# Patient Record
Sex: Female | Born: 1940 | Race: White | Hispanic: No | Smoking: Never smoker
Health system: Southern US, Community
[De-identification: ages and names within clinical notes are randomized; demographics above are authoritative.]

## PROBLEM LIST (undated history)

## (undated) ENCOUNTER — Emergency Department (HOSPITAL_BASED_OUTPATIENT_CLINIC_OR_DEPARTMENT_OTHER): Payer: Medicare Other | Source: Home / Self Care

## (undated) DIAGNOSIS — I10 Essential (primary) hypertension: Secondary | ICD-10-CM

## (undated) DIAGNOSIS — J40 Bronchitis, not specified as acute or chronic: Secondary | ICD-10-CM

## (undated) HISTORY — PX: KNEE SURGERY: SHX244

---

## 2017-12-04 ENCOUNTER — Emergency Department (HOSPITAL_BASED_OUTPATIENT_CLINIC_OR_DEPARTMENT_OTHER): Payer: Medicare Other

## 2017-12-04 ENCOUNTER — Encounter (HOSPITAL_BASED_OUTPATIENT_CLINIC_OR_DEPARTMENT_OTHER): Payer: Self-pay | Admitting: *Deleted

## 2017-12-04 ENCOUNTER — Other Ambulatory Visit: Payer: Self-pay

## 2017-12-04 ENCOUNTER — Emergency Department (HOSPITAL_BASED_OUTPATIENT_CLINIC_OR_DEPARTMENT_OTHER)
Admission: EM | Admit: 2017-12-04 | Discharge: 2017-12-05 | Disposition: A | Discharge status: Home / Self Care | Payer: Medicare Other | Attending: Emergency Medicine | Admitting: Emergency Medicine

## 2017-12-04 DIAGNOSIS — I10 Essential (primary) hypertension: Secondary | ICD-10-CM | POA: Insufficient documentation

## 2017-12-04 DIAGNOSIS — R062 Wheezing: Secondary | ICD-10-CM | POA: Diagnosis present

## 2017-12-04 DIAGNOSIS — J189 Pneumonia, unspecified organism: Secondary | ICD-10-CM | POA: Insufficient documentation

## 2017-12-04 HISTORY — DX: Bronchitis, not specified as acute or chronic: J40

## 2017-12-04 HISTORY — DX: Essential (primary) hypertension: I10

## 2017-12-04 MED ORDER — ALBUTEROL SULFATE (2.5 MG/3ML) 0.083% IN NEBU
2.5000 mg | INHALATION_SOLUTION | Freq: Once | RESPIRATORY_TRACT | Status: AC
  Administered 2017-12-04: 2.5 mg via RESPIRATORY_TRACT
  Filled 2017-12-04: qty 3

## 2017-12-04 MED ORDER — ALBUTEROL SULFATE (2.5 MG/3ML) 0.083% IN NEBU
5.0000 mg | INHALATION_SOLUTION | Freq: Once | RESPIRATORY_TRACT | Status: AC
  Administered 2017-12-04: 5 mg via RESPIRATORY_TRACT
  Filled 2017-12-04: qty 6

## 2017-12-04 MED ORDER — METHYLPREDNISOLONE SODIUM SUCC 125 MG IJ SOLR
125.0000 mg | Freq: Once | INTRAMUSCULAR | Status: DC
  Filled 2017-12-04: qty 2

## 2017-12-04 NOTE — ED Triage Notes (Addendum)
Wheezing. Diagnosed with bronchitis a week ago. She has been seen 3 times this week at [facility]. She is able to speak in complete sentences with no wheezing or upper airway noises. She has audible wheezing only when lungs are being evaluated.

## 2017-12-04 NOTE — ED Provider Notes (Signed)
[facility] EMERGENCY DEPARTMENT Provider Note   CSN: [id number] Arrival date & time: 12/04/17  1948     History   Chief Complaint Chief Complaint  Patient presents with  . Wheezing    HPI Maria Patrick is a 77 y.o. female.  This patient is a 77 year old female with past medical history of hypertension, bronchitis, knee surgery presenting for evaluation of chest congestion, wheezing, and cough that has worsened over the past week.  She has been seen on 3 separate occasions by her primary doctor and urgent care.  She is taking steroids, Zithromax, and an inhaler with little relief.  She tells me she experienced a pulmonary embolism after a knee surgery last year.  She was on Eliquis for 6 months, and this was discontinued in January.  She denies any fevers or chills.   The history is provided by the patient.  Wheezing   This is a new problem. Episode onset: 1 week ago. The problem occurs constantly. The problem has been gradually worsening. Pertinent negatives include no chest pain, no fever and no sputum production. She has tried nothing for the symptoms. Her past medical history is significant for PE.    Past Medical History:  Diagnosis Date  . Bronchitis   . Hypertension     There are no active problems to display for this patient.   Past Surgical History:  Procedure Laterality Date  . KNEE SURGERY      OB History    No data available       Home Medications    Prior to Admission medications   Medication Sig Start Date End Date Taking? Authorizing Provider  ALBUTEROL IN Inhale into the lungs.   Yes [provider]  ALPRAZolam (NIRAVAM) 0.25 MG dissolvable tablet Take 0.25 mg by mouth at bedtime as needed for anxiety.   Yes [provider]  HYDROCHLOROTHIAZIDE PO Take by mouth.   Yes [provider]  QUINAPRIL HCL PO Take by mouth.   Yes [provider]    Family History No family history on file.  Social  History Social History   Tobacco Use  . Smoking status: Never Smoker  . Smokeless tobacco: Never Used  Substance Use Topics  . Alcohol use: Yes  . Drug use: No     Allergies   Ceclor [cefaclor] and Clindamycin/lincomycin   Review of Systems Review of Systems  Constitutional: Negative for fever.  Respiratory: Positive for wheezing. Negative for sputum production.   Cardiovascular: Negative for chest pain.  All other systems reviewed and are negative.    Physical Exam Updated Vital Signs BP (!) 161/83 (BP Location: Right Arm)   Pulse 81   Temp 98.3 F (36.8 C) (Oral)   Resp 20   Ht 5' 4.5" (1.638 m)   Wt 81.6 kg (180 lb)   SpO2 94%   BMI 30.42 kg/m   Physical Exam  Constitutional: She is oriented to person, place, and time. She appears well-developed and well-nourished. No distress.  HENT:  Head: Normocephalic and atraumatic.  Neck: Normal range of motion. Neck supple.  Cardiovascular: Normal rate and regular rhythm. Exam reveals no gallop and no friction rub.  No murmur heard. Pulmonary/Chest: Effort normal. No respiratory distress. She has wheezes.  Expiratory rhonchi present.  Abdominal: Soft. Bowel sounds are normal. She exhibits no distension. There is no tenderness.  Musculoskeletal: Normal range of motion. She exhibits no edema.  There is no calf tenderness.  Homans sign is absent bilaterally.  Neurological: She is alert and oriented to person, place, and time.  Skin: Skin is warm and dry. She is not diaphoretic.  Nursing note and vitals reviewed.    ED Treatments / Results  Labs (all labs ordered are listed, but only abnormal results are displayed) Labs Reviewed  BASIC METABOLIC PANEL  CBC WITH DIFFERENTIAL/PLATELET  BRAIN NATRIURETIC PEPTIDE    EKG  EKG Interpretation None       Radiology No results found.  Procedures Procedures (including critical care time)  Medications Ordered in ED Medications  methylPREDNISolone sodium  succinate (SOLU-MEDROL) 125 mg/2 mL injection 125 mg (not administered)  albuterol (PROVENTIL) (2.5 MG/3ML) 0.083% nebulizer solution 5 mg (5 mg Nebulization Given 12/04/17 2012)  albuterol (PROVENTIL) (2.5 MG/3ML) 0.083% nebulizer solution 2.5 mg (2.5 mg Nebulization Given 12/04/17 2156)     Initial Impression / Assessment and Plan / ED Course  I have reviewed the triage vital signs and the nursing notes.  Pertinent labs & imaging results that were available during my care of the patient were reviewed by me and considered in my medical decision making (see chart for details).  Patient presenting with wheezing, cough.  She is recently been started on Zithromax, prednisone, and was given a prescription for albuterol to be given by nebulizer.  She has not started this yet.  She has a history of PE and states that the symptoms are similar to what she experienced with that illness.  Her workup reveals no evidence for PE but does show what appears to be pneumonia in the bases bilaterally..  She did have significant wheezing upon arrival and does sound somewhat better after receiving a breathing treatment here.  She will be discharged, to continue her current therapy.  Final Clinical Impressions(s) / ED Diagnoses   Final diagnoses:  None    ED Discharge Orders    None       Geoffery Lyonselo, Torre [place], MD 12/05/17 [phone]

## 2017-12-05 ENCOUNTER — Emergency Department (HOSPITAL_BASED_OUTPATIENT_CLINIC_OR_DEPARTMENT_OTHER): Payer: Medicare Other

## 2017-12-05 LAB — CBC WITH DIFFERENTIAL/PLATELET
BASOS ABS: 0 10*3/uL (ref 0.0–0.1)
BASOS PCT: 0 %
EOS PCT: 0 %
Eosinophils Absolute: 0 10*3/uL (ref 0.0–0.7)
HCT: 39.7 % (ref 36.0–46.0)
HEMOGLOBIN: 13.1 g/dL (ref 12.0–15.0)
LYMPHS PCT: 10 %
Lymphs Abs: 0.7 10*3/uL (ref 0.7–4.0)
MCH: 30 pg (ref 26.0–34.0)
MCHC: 33 g/dL (ref 30.0–36.0)
MCV: 90.8 fL (ref 78.0–100.0)
MONO ABS: 0.2 10*3/uL (ref 0.1–1.0)
MONOS PCT: 3 %
NEUTROS ABS: 6.5 10*3/uL (ref 1.7–7.7)
Neutrophils Relative %: 87 %
Platelets: 349 10*3/uL (ref 150–400)
RBC: 4.37 MIL/uL (ref 3.87–5.11)
RDW: 13.8 % (ref 11.5–15.5)
WBC: 7.5 10*3/uL (ref 4.0–10.5)

## 2017-12-05 LAB — BASIC METABOLIC PANEL
ANION GAP: 13 (ref 5–15)
BUN: 13 mg/dL (ref 6–20)
CALCIUM: 8.9 mg/dL (ref 8.9–10.3)
CO2: 25 mmol/L (ref 22–32)
Chloride: 96 mmol/L — ABNORMAL LOW (ref 101–111)
Creatinine, Ser: 0.72 mg/dL (ref 0.44–1.00)
GFR calc Af Amer: >60 mL/min (ref >60)
GLUCOSE: 174 mg/dL — AB (ref 65–99)
Potassium: 3 mmol/L — ABNORMAL LOW (ref 3.5–5.1)
Sodium: 134 mmol/L — ABNORMAL LOW (ref 135–145)

## 2017-12-05 LAB — BRAIN NATRIURETIC PEPTIDE: B Natriuretic Peptide: 21.8 pg/mL (ref 0.0–100.0)

## 2017-12-05 MED ORDER — IOPAMIDOL (ISOVUE-370) INJECTION 76%
125.0000 mL | Freq: Once | INTRAVENOUS | Status: AC | PRN
  Administered 2017-12-05: 100 mL via INTRAVENOUS

## 2017-12-05 NOTE — ED Notes (Signed)
PT discharged to home with family. NAD. 

## 2017-12-05 NOTE — Discharge Instructions (Signed)
Continue Zithromax, prednisone, and albuterol treatments as previously prescribed.  Return to the emergency department if symptoms significantly worsen or change.

## 2019-09-23 IMAGING — CT CT ANGIO CHEST
2 of 8 series · 19 of 36 positions shown · IV contrast (iopamidol)
Comparison: None.

CLINICAL DATA: Acute onset of cough and shortness of breath.

EXAM:
CT ANGIOGRAPHY CHEST WITH CONTRAST
TECHNIQUE: Multidetector CT imaging of the chest was performed using the
standard protocol during bolus administration of intravenous
contrast. Multiplanar CT image reconstructions and MIPs were
obtained to evaluate the vascular anatomy.
CONTRAST:  100mL PQ7NP5-D3H IOPAMIDOL (PQ7NP5-D3H) INJECTION 76%

[Series 6: pe thins · axial · 0.68mm/px · z∈[-145,+140]mm · 18 of 424 slices shown]
[im 22/424  lung]
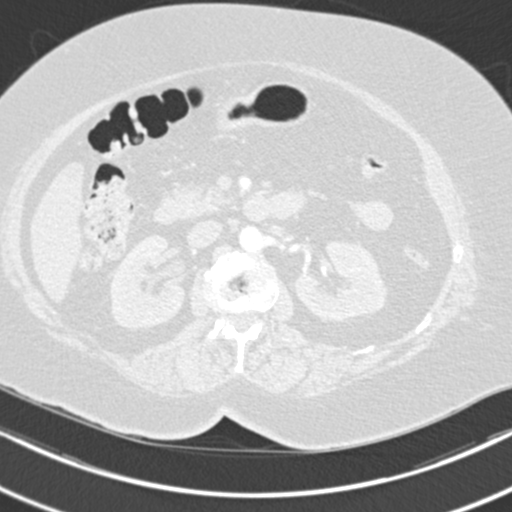
[im 43/424  mediastinal]
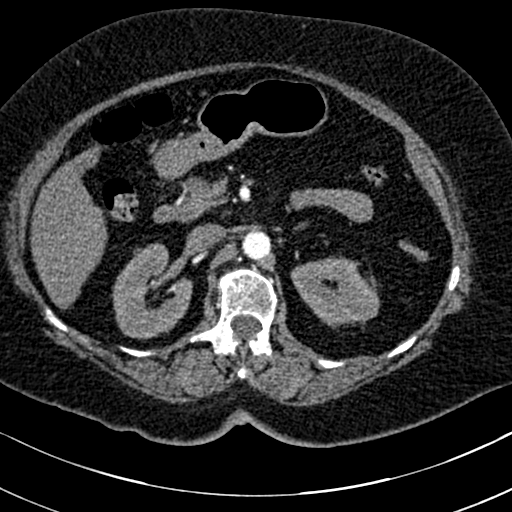
[im 64/424  lung]
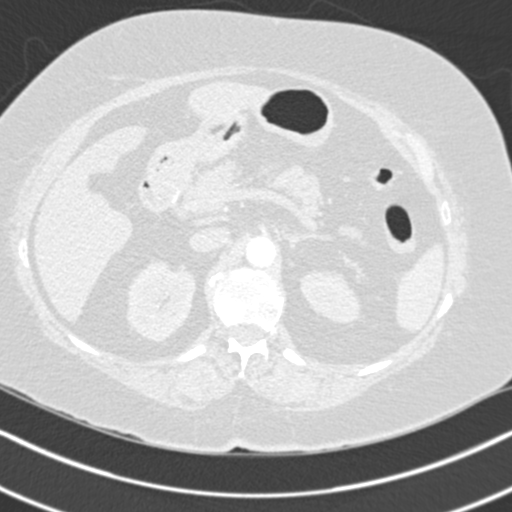
[im 85/424  mediastinal]
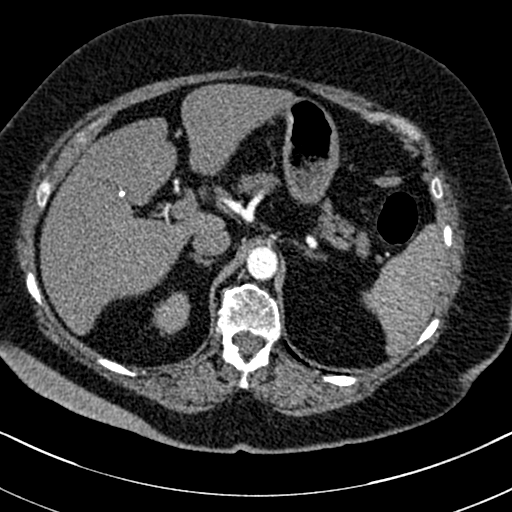
[im 106/424  lung]
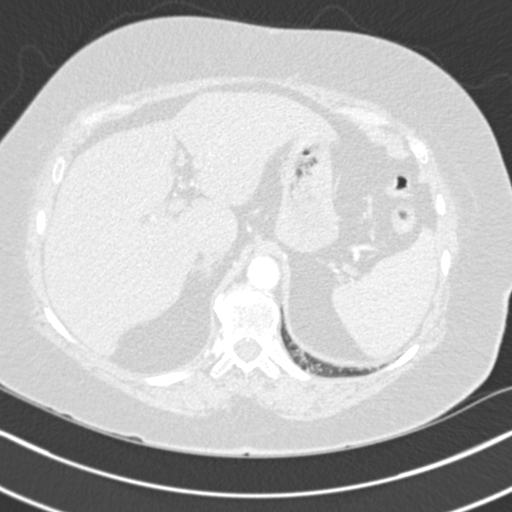
[im 127/424  mediastinal]
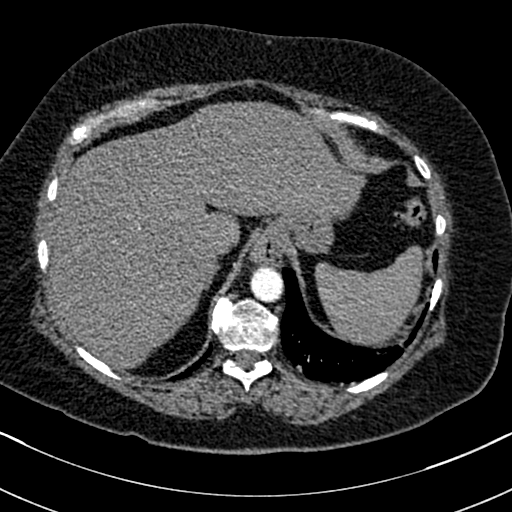
[im 149/424  lung]
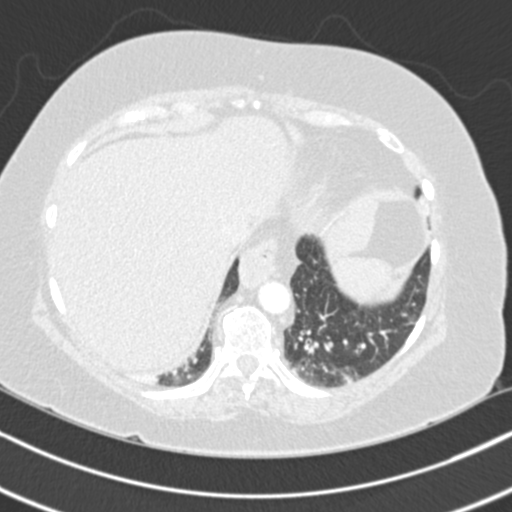
[im 170/424  mediastinal]
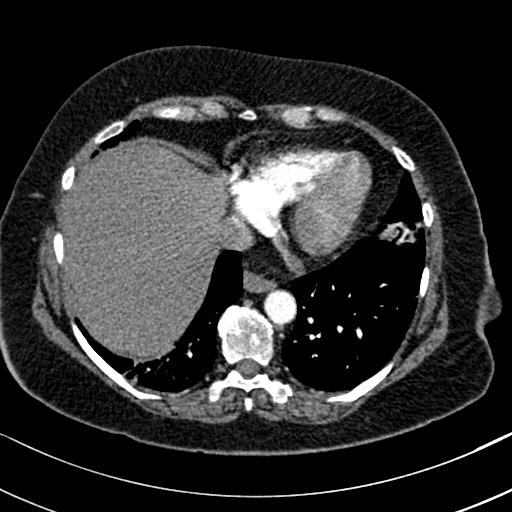
[im 191/424  lung]
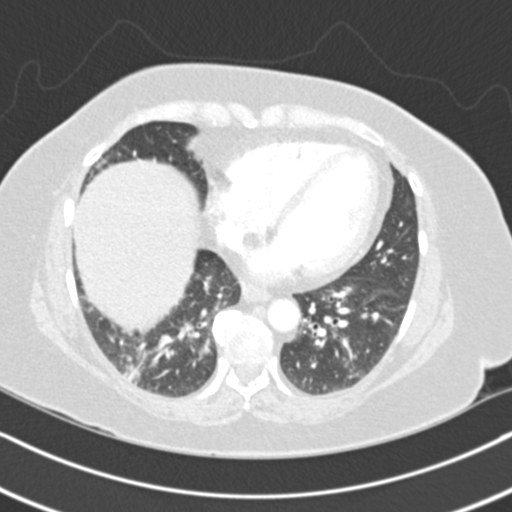
[im 233/424  mediastinal]
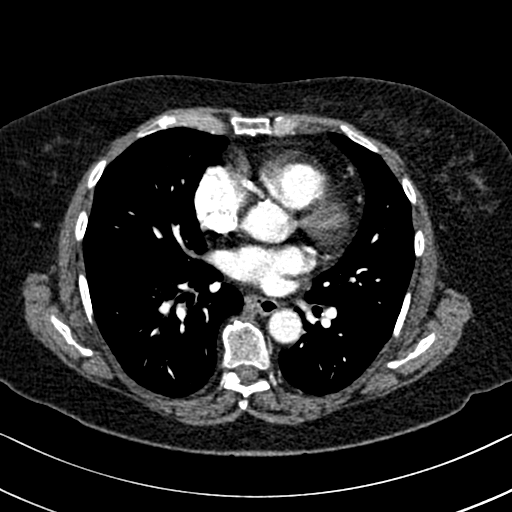
[im 254/424  lung]
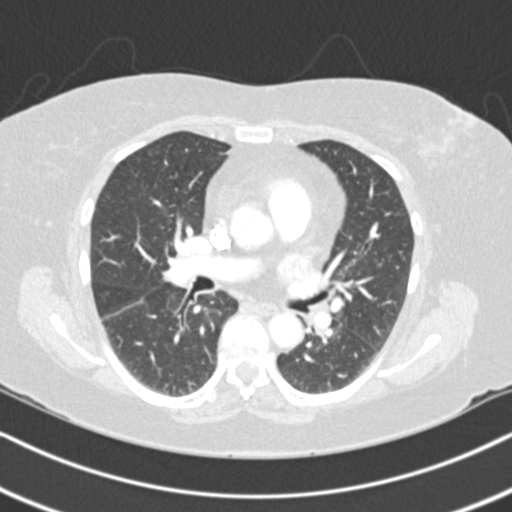
[im 275/424  mediastinal]
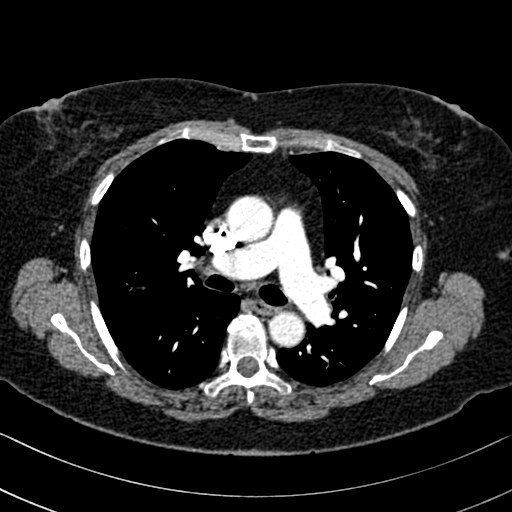
[im 297/424  lung]
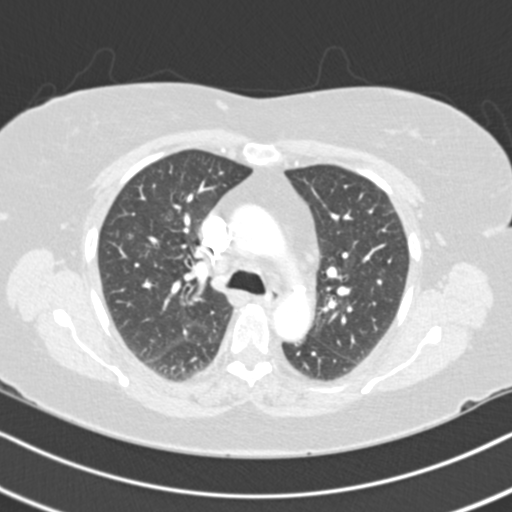
[im 318/424  mediastinal]
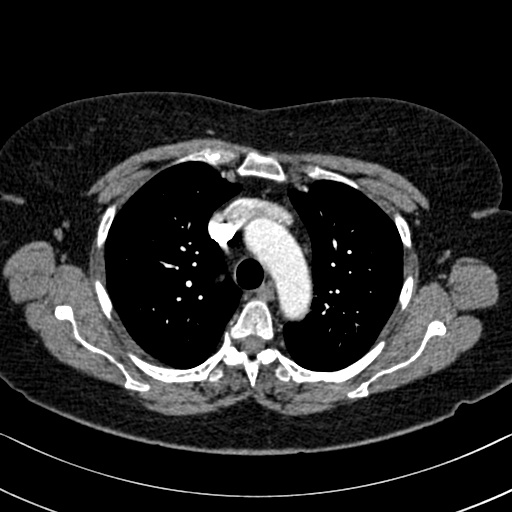
[im 339/424  lung]
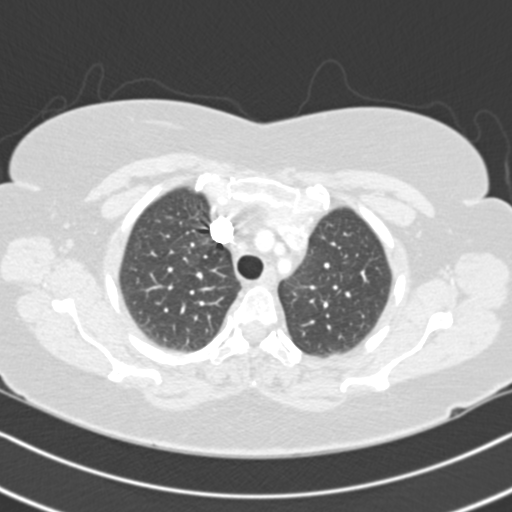
[im 360/424  mediastinal]
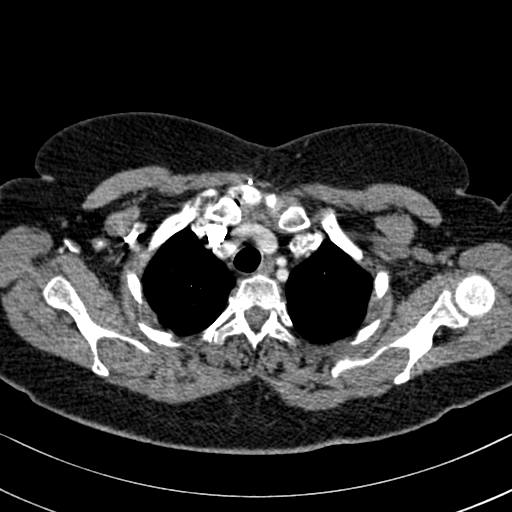
[im 381/424  lung]
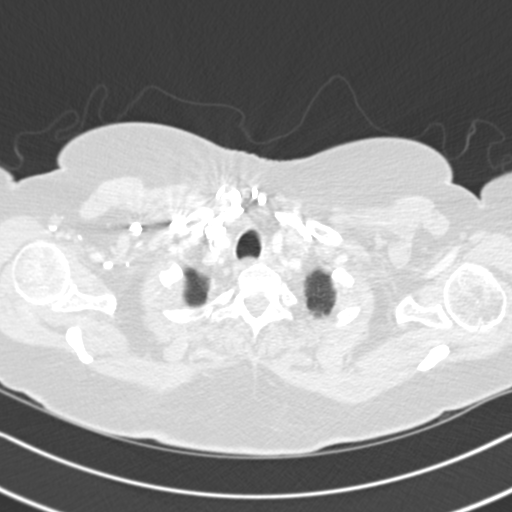
[im 402/424  mediastinal]
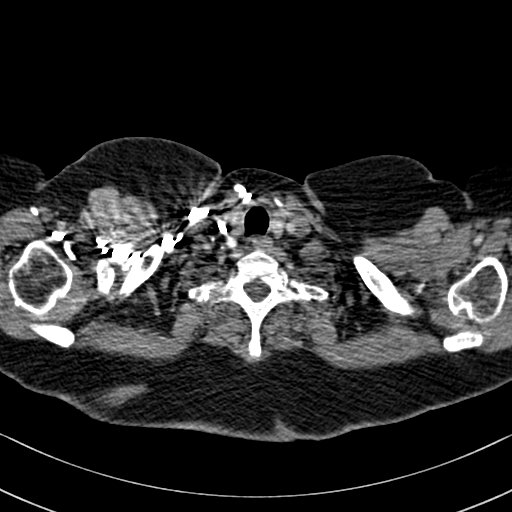

[Series 7: pe coronal mpr · coronal · 0.63mm/px · 1 of 96 slices shown]
[im 48/96  mediastinal]
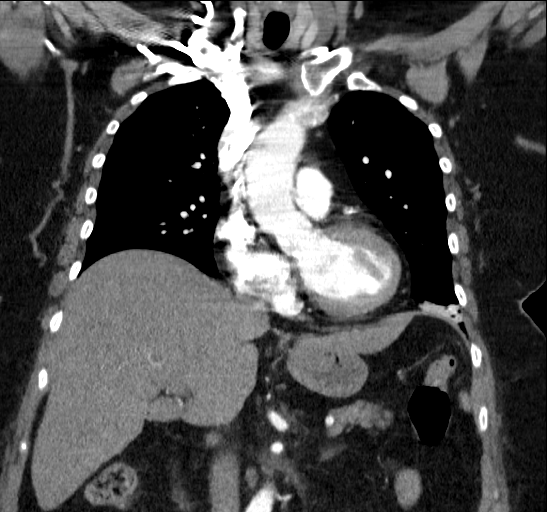

[19 of 36 positions shown; findings below may reference images not displayed]

FINDINGS: Cardiovascular:  There is no evidence of pulmonary embolus.

The heart is normal in size. The aortic arch demonstrates minimal
calcification but is otherwise unremarkable. The great vessels are
within normal limits.

Mediastinum/Nodes: The mediastinum is unremarkable in appearance. No
mediastinal lymphadenopathy is seen. No pericardial effusion is
identified. The visualized portions of the thyroid gland are
unremarkable. No axillary lymphadenopathy is seen.

Lungs/Pleura: Minimal nodular opacities are noted at the lung bases,
concerning for acute infection. No pleural effusion or pneumothorax
is seen. No dominant mass is identified.

Upper Abdomen: The visualized portions of the liver and spleen are
unremarkable. The patient is status post cholecystectomy, with clips
noted at the gallbladder fossa. A small hiatal hernia is noted. The
visualized portions of the pancreas, adrenal glands and kidneys are
grossly unremarkable.

Musculoskeletal: No acute osseous abnormalities are identified. The
visualized musculature is unremarkable in appearance.

Review of the MIP images confirms the above findings.
IMPRESSION: 1. No evidence of pulmonary embolus.
2. Minimal nodular opacities at the lung bases, concerning for acute
infection.
3. Small hiatal hernia.
